# Patient Record
Sex: Female | Born: 1995 | Race: Black or African American | Hispanic: No | Marital: Single | Smoking: Never smoker
Health system: Southern US, Community
[De-identification: ages and names within clinical notes are randomized; demographics above are authoritative.]

---
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
## 2017-02-13 IMAGING — CR DG CHEST 1V
1 series · 1 of 1 positions shown · non-contrast
Comparison: None.

CLINICAL DATA: Screening for tuberculosis.

EXAM:
CHEST 1 VIEW

[PA]
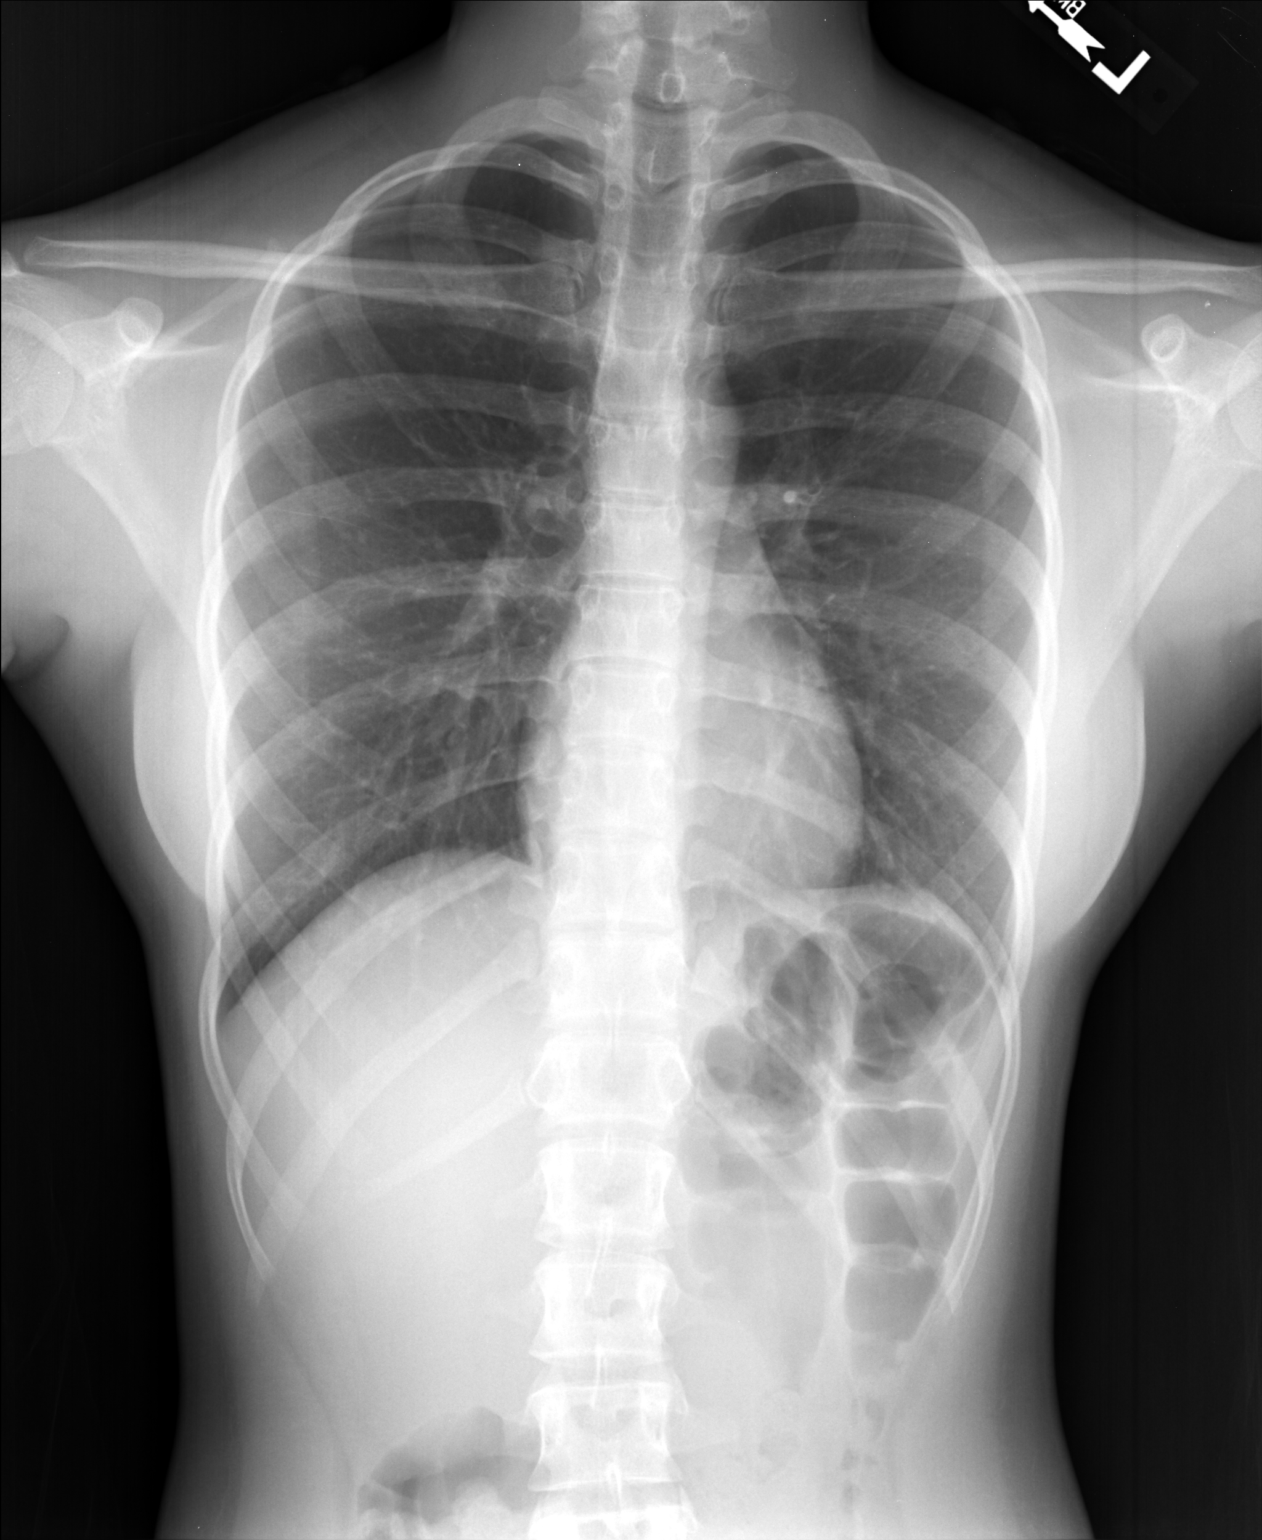

[1 of 1 positions shown; findings below may reference images not displayed]

FINDINGS: The heart size and mediastinal contours are within normal limits.
Both lungs are clear. The visualized skeletal structures are
unremarkable.
IMPRESSION: No active disease.
